# Patient Record
Sex: Female | Born: 2000 | Race: Black or African American | Hispanic: No | Marital: Single | Smoking: Never smoker
Health system: Southern US, Community
[De-identification: ages and names within clinical notes are randomized; demographics above are authoritative.]

## PROBLEM LIST (undated history)

## (undated) ENCOUNTER — Ambulatory Visit: Source: Home / Self Care

## (undated) DIAGNOSIS — K219 Gastro-esophageal reflux disease without esophagitis: Secondary | ICD-10-CM

---
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
## 2021-04-29 IMAGING — CR DG CHEST 2V
2 series · 2 of 2 positions shown · non-contrast
Comparison: None.

CLINICAL DATA: 19-year-old female with chest pain for 3-4 days and
headache.

EXAM:
CHEST - 2 VIEW

[chest pa]
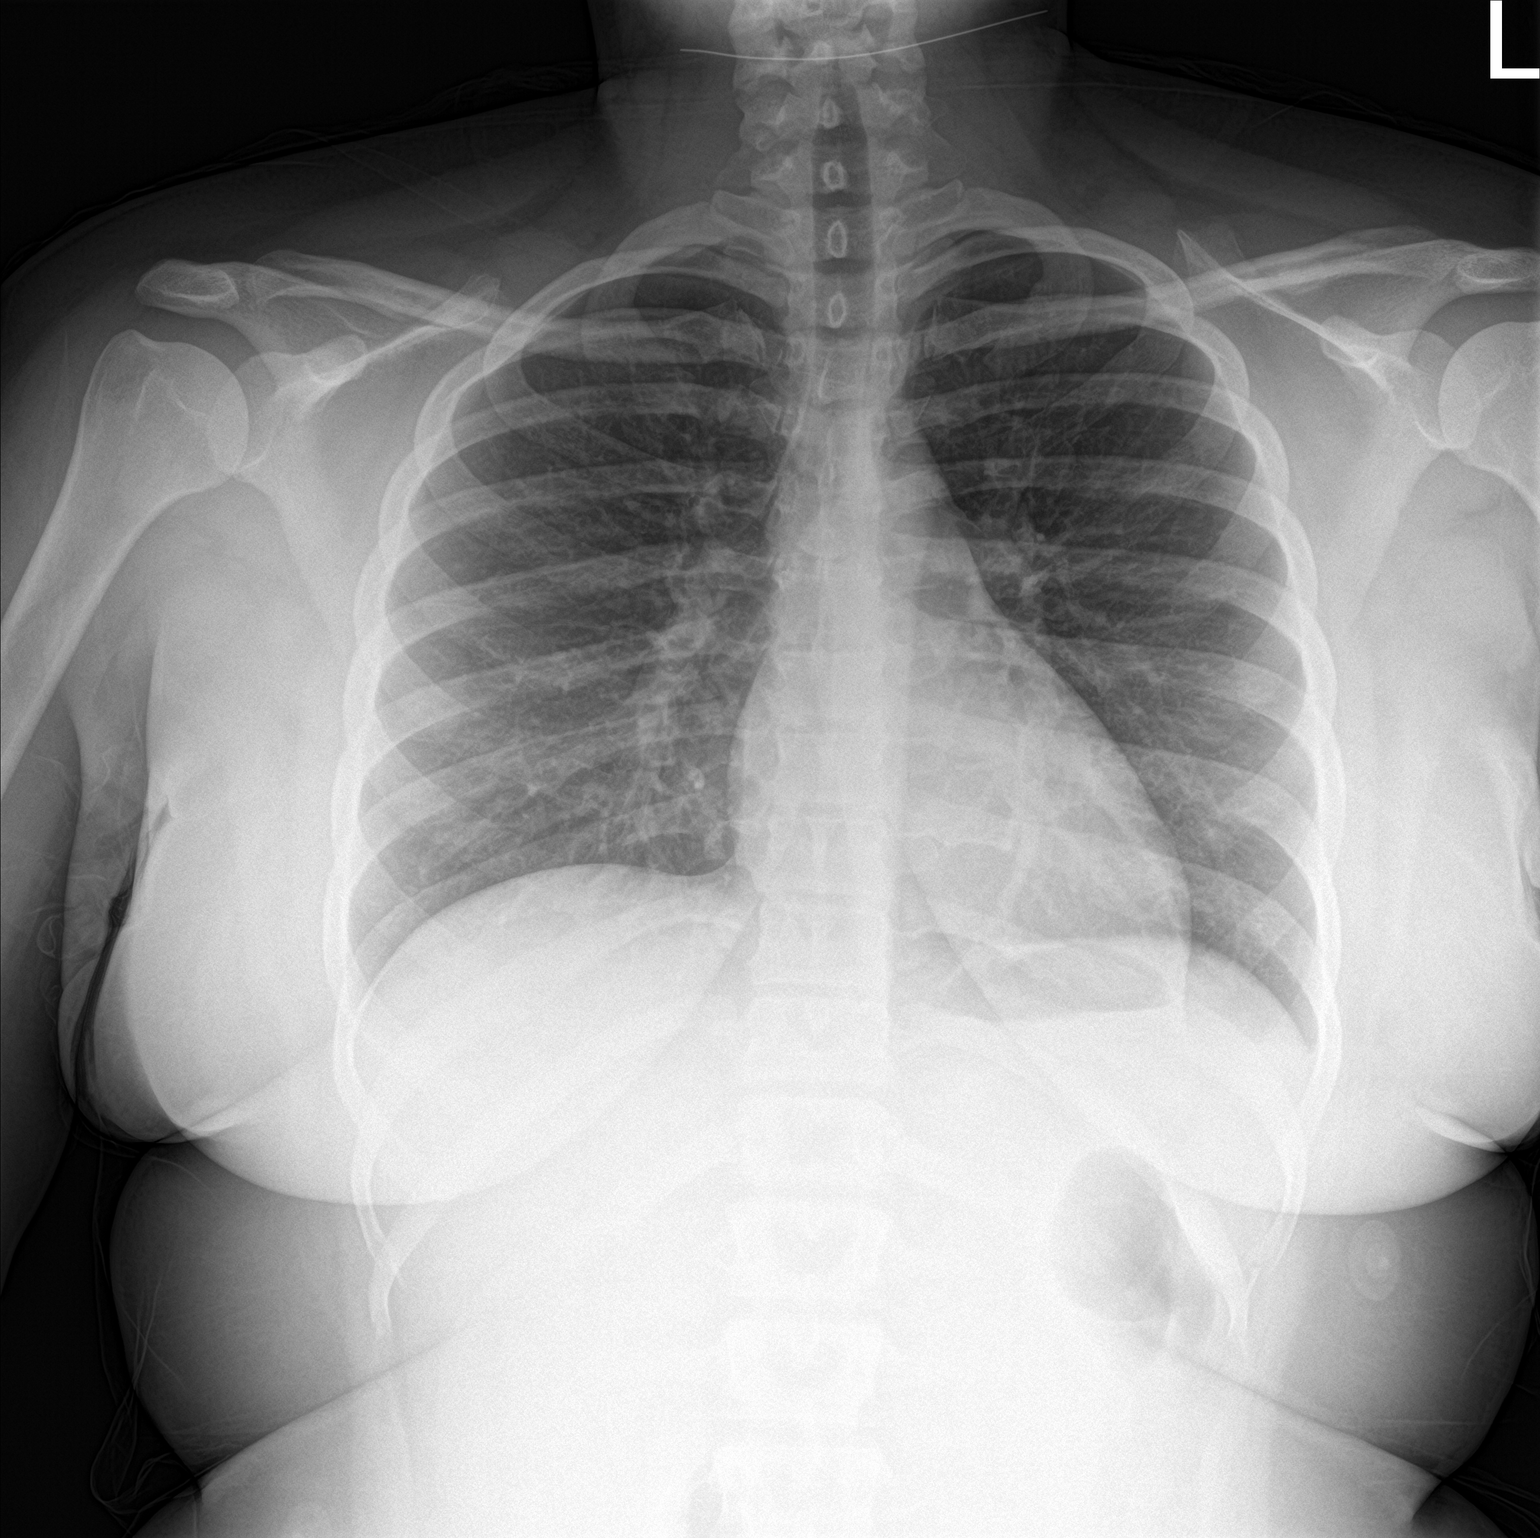

[chest lat]
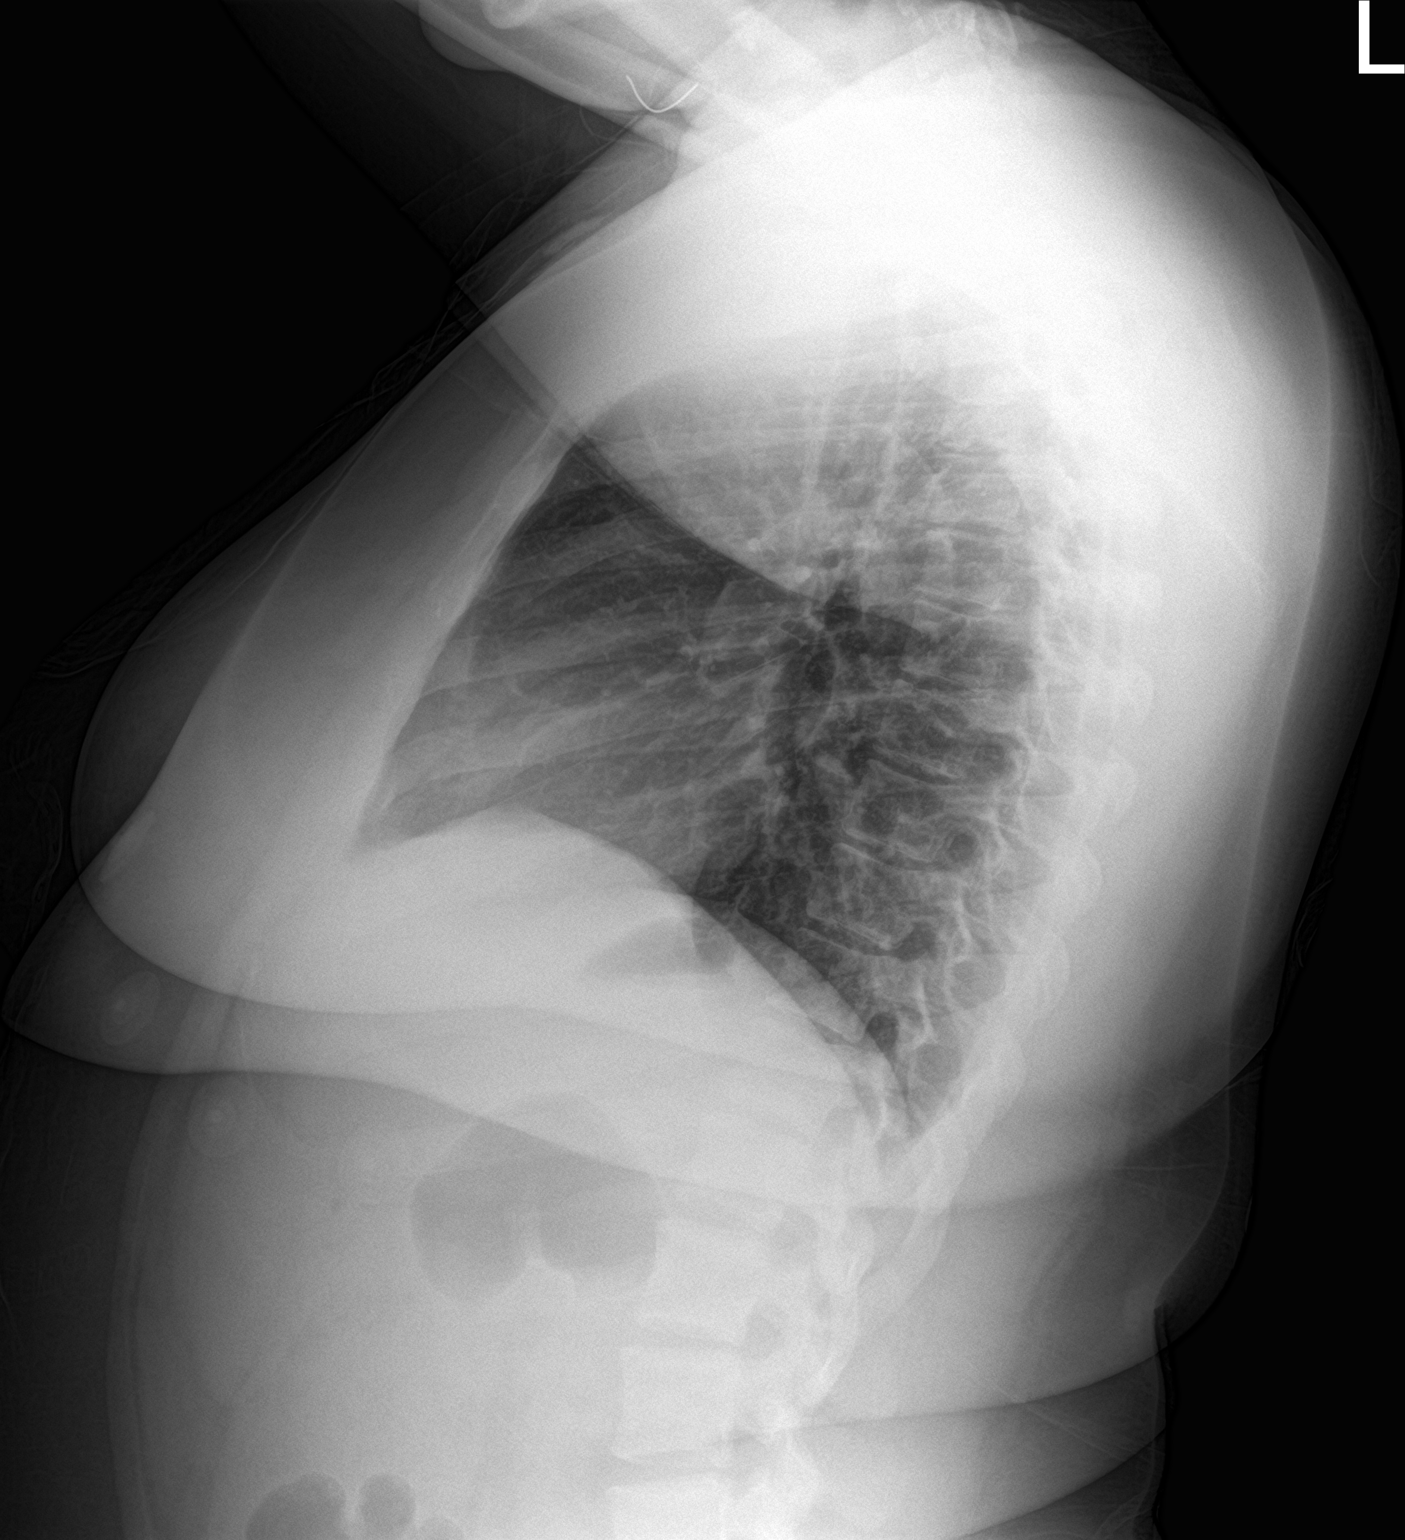

[2 of 2 positions shown; findings below may reference images not displayed]

FINDINGS: Normal lung volumes and mediastinal contours. Visualized tracheal
air column is within normal limits. Both lungs appear clear. No
pneumothorax or pleural effusion.

No osseous abnormality identified. Negative visible bowel gas
pattern.
IMPRESSION: Negative.  No cardiopulmonary abnormality.

## 2022-05-25 ENCOUNTER — Encounter: Payer: Self-pay | Admitting: Emergency Medicine

## 2022-05-25 ENCOUNTER — Ambulatory Visit
Admission: EM | Admit: 2022-05-25 | Discharge: 2022-05-25 | Disposition: A | Discharge status: Home / Self Care | Payer: Medicaid Other | Attending: Family Medicine | Admitting: Family Medicine

## 2022-05-25 DIAGNOSIS — J029 Acute pharyngitis, unspecified: Secondary | ICD-10-CM | POA: Insufficient documentation

## 2022-05-25 DIAGNOSIS — J019 Acute sinusitis, unspecified: Secondary | ICD-10-CM | POA: Insufficient documentation

## 2022-05-25 LAB — POCT RAPID STREP A (OFFICE): Rapid Strep A Screen: NEGATIVE

## 2022-05-25 MED ORDER — BENZONATATE 100 MG PO CAPS: 100.0000 mg | ORAL_CAPSULE | Freq: Three times a day (TID) | ORAL | 0 refills | Status: DC | PRN

## 2022-05-25 MED ORDER — IBUPROFEN 800 MG PO TABS
800.0000 mg | ORAL_TABLET | Freq: Three times a day (TID) | ORAL | 0 refills | Status: DC | PRN
Start: 2022-05-25 — End: 2023-04-25

## 2022-05-25 MED ORDER — AMOXICILLIN 875 MG PO TABS: 875.0000 mg | ORAL_TABLET | Freq: Two times a day (BID) | ORAL | 0 refills | Status: AC

## 2022-05-25 NOTE — ED Triage Notes (Signed)
Pt is present today with left ear pain and sore throat. Pt sx started x2 days ago

## 2022-05-25 NOTE — ED Provider Notes (Signed)
[facility]    CSN: [id number] Arrival date & time: 05/25/22  1401      History   Chief Complaint Chief Complaint  Patient presents with   Sore Throat   Otalgia    HPI Brittany Rodriguez is a 21 y.o. female.    Sore Throat  Otalgia Here for 7-day history of sore throat, cough, and nasal congestion.  About 2 to 3 days ago her ear on the left started hurting also.  She feels that she had some fever in the last 2 or 3 days.  Last menstrual period was sometime ago as she is on birth control  History reviewed. No pertinent past medical history.  There are no problems to display for this patient.   History reviewed. No pertinent surgical history.  OB History   No obstetric history on file.      Home Medications    Prior to Admission medications   Medication Sig Start Date End Date Taking? Authorizing Provider  amoxicillin (AMOXIL) 875 MG tablet Take 1 tablet (875 mg total) by mouth 2 (two) times daily for 7 days. 05/25/22 06/01/22 Yes Beckam Abdulaziz, Gwenlyn Perking, MD  benzonatate (TESSALON) 100 MG capsule Take 1 capsule (100 mg total) by mouth 3 (three) times daily as needed for cough. 05/25/22  Yes Barrett Henle, MD  ibuprofen (ADVIL) 800 MG tablet Take 1 tablet (800 mg total) by mouth every 8 (eight) hours as needed (pain). 05/25/22  Yes Barrett Henle, MD  famotidine (PEPCID) 20 MG tablet Take 1 tablet (20 mg total) by mouth 2 (two) times daily. 08/05/20 11/03/20  Paulla Dolly, MD    Family History History reviewed. No pertinent family history.  Social History Social History   Tobacco Use   Smoking status: Passive Smoke Exposure - Never Smoker   Smokeless tobacco: Never  Substance Use Topics   Drug use: Yes    Frequency: 3.0 times per week    Types: Marijuana    Comment: 1 or 2 a day     Allergies   Patient has no known allergies.   Review of Systems Review of Systems  HENT:  Positive for ear pain.     Physical Exam Triage Vital Signs ED  Triage Vitals  Enc Vitals Group     BP 05/25/22 1447 106/70     Pulse Rate 05/25/22 1447 64     Resp 05/25/22 1447 18     Temp 05/25/22 1447 98.2 F (36.8 C)     Temp src --      SpO2 05/25/22 1447 99 %     Weight --      Height --      Head Circumference --      Peak Flow --      Pain Score 05/25/22 1446 7     Pain Loc --      Pain Edu? --      Excl. in [place]? --    No data found.  Updated Vital Signs BP 106/70   Pulse 64   Temp 98.2 F (36.8 C)   Resp 18   SpO2 99%   Visual Acuity Right Eye Distance:   Left Eye Distance:   Bilateral Distance:    Right Eye Near:   Left Eye Near:    Bilateral Near:     Physical Exam Vitals reviewed.  Constitutional:      General: She is not in acute distress.    Appearance: She is not toxic-appearing.  HENT:     Right Ear: Tympanic membrane and ear canal normal.     Left Ear: Tympanic membrane and ear canal normal.     Ears:     Comments: Tympanic membrane's are bilaterally gray and shiny    Nose: Congestion present.     Mouth/Throat:     Mouth: Mucous membranes are moist.     Comments: There is clear mucus draining and no erythema in the oropharynx Eyes:     Extraocular Movements: Extraocular movements intact.     Conjunctiva/sclera: Conjunctivae normal.     Pupils: Pupils are equal, round, and reactive to light.  Cardiovascular:     Rate and Rhythm: Normal rate and regular rhythm.     Heart sounds: No murmur heard. Pulmonary:     Effort: Pulmonary effort is normal. No respiratory distress.     Breath sounds: No wheezing, rhonchi or rales.  Chest:     Chest wall: No tenderness.  Musculoskeletal:     Cervical back: Neck supple.  Lymphadenopathy:     Cervical: No cervical adenopathy.  Skin:    Capillary Refill: Capillary refill takes less than 2 seconds.     Coloration: Skin is not jaundiced or pale.  Neurological:     General: No focal deficit present.     Mental Status: She is alert and oriented to person, place,  and time.  Psychiatric:        Behavior: Behavior normal.     UC Treatments / Results  Labs (all labs ordered are listed, but only abnormal results are displayed) Labs Reviewed  CULTURE, GROUP A STREP [facility])  POCT RAPID STREP A (OFFICE)    EKG   Radiology No results found.  Procedures Procedures (including critical care time)  Medications Ordered in UC Medications - No data to display  Initial Impression / Assessment and Plan / UC Course  I have reviewed the triage vital signs and the nursing notes.  Pertinent labs & imaging results that were available during my care of the patient were reviewed by me and considered in my medical decision making (see chart for details).     I will treat with amoxicillin for possible sinus infection, due to the length of her symptoms.  I will not do a COVID swab today's due to the length of her symptoms Final Clinical Impressions(s) / UC Diagnoses   Final diagnoses:  Acute sinusitis, recurrence not specified, unspecified location  Sore throat     Discharge Instructions      Your strep test is negative.  Culture of the throat will be sent, and staff will notify you if that is in turn positive.  Take amoxicillin 875 mg--1 tab twice daily for 7 days; this is for possible sinus infection  Take ibuprofen 800 mg--1 tab every 8 hours as needed for pain.  Take benzonatate 100 mg, 1 tab every 8 hours as needed for cough.      ED Prescriptions     Medication Sig Dispense Auth. Provider   amoxicillin (AMOXIL) 875 MG tablet Take 1 tablet (875 mg total) by mouth 2 (two) times daily for 7 days. 14 tablet Pluma Diniz, Janace Aris, MD   benzonatate (TESSALON) 100 MG capsule Take 1 capsule (100 mg total) by mouth 3 (three) times daily as needed for cough. 21 capsule Zenia Resides, MD   ibuprofen (ADVIL) 800 MG tablet Take 1 tablet (800 mg total) by mouth every 8 (eight) hours as needed (pain). 21 tablet [address]  K, MD      PDMP  not reviewed this encounter.   Barrett Henle, MD 05/25/22 [phone]

## 2022-05-25 NOTE — Discharge Instructions (Addendum)
Your strep test is negative.  Culture of the throat will be sent, and staff will notify you if that is in turn positive.  Take amoxicillin 875 mg--1 tab twice daily for 7 days; this is for possible sinus infection  Take ibuprofen 800 mg--1 tab every 8 hours as needed for pain.  Take benzonatate 100 mg, 1 tab every 8 hours as needed for cough.

## 2022-05-28 LAB — CULTURE, GROUP A STREP (THRC)

## 2022-10-08 ENCOUNTER — Encounter (HOSPITAL_COMMUNITY): Payer: Self-pay

## 2022-10-08 ENCOUNTER — Other Ambulatory Visit: Payer: Self-pay

## 2022-10-08 ENCOUNTER — Emergency Department (HOSPITAL_COMMUNITY): Payer: Medicaid Other

## 2022-10-08 ENCOUNTER — Emergency Department (HOSPITAL_COMMUNITY)
Admission: EM | Admit: 2022-10-08 | Discharge: 2022-10-08 | Disposition: A | Discharge status: Home / Self Care | Payer: Medicaid Other | Attending: Student | Admitting: Student

## 2022-10-08 DIAGNOSIS — M546 Pain in thoracic spine: Secondary | ICD-10-CM | POA: Insufficient documentation

## 2022-10-08 DIAGNOSIS — M25561 Pain in right knee: Secondary | ICD-10-CM | POA: Insufficient documentation

## 2022-10-08 MED ORDER — IBUPROFEN 200 MG PO TABS
600.0000 mg | ORAL_TABLET | Freq: Once | ORAL | Status: AC
  Administered 2022-10-08: 600 mg via ORAL
  Filled 2022-10-08: qty 1
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
## 2022-10-08 NOTE — Progress Notes (Signed)
Orthopedic Tech Progress Note Patient Details:  Brittany Rodriguez September 28, 2001 [id number]  Ortho Devices Type of Ortho Device: Knee Sleeve Ortho Device/Splint Location: Right knee Ortho Device/Splint Interventions: Application   Post Interventions Patient Tolerated: Well  Linus Salmons Leslieann Whisman 10/08/2022, 4:52 AM
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
## 2022-10-11 ENCOUNTER — Ambulatory Visit
Admission: EM | Admit: 2022-10-11 | Discharge: 2022-10-11 | Disposition: A | Discharge status: Home / Self Care | Payer: Medicaid Other | Attending: Physician Assistant | Admitting: Physician Assistant

## 2022-10-11 ENCOUNTER — Ambulatory Visit (INDEPENDENT_AMBULATORY_CARE_PROVIDER_SITE_OTHER): Payer: Medicaid Other

## 2022-10-11 DIAGNOSIS — M25571 Pain in right ankle and joints of right foot: Secondary | ICD-10-CM | POA: Diagnosis not present

## 2022-10-11 DIAGNOSIS — M549 Dorsalgia, unspecified: Secondary | ICD-10-CM | POA: Diagnosis not present

## 2022-10-11 DIAGNOSIS — M79671 Pain in right foot: Secondary | ICD-10-CM | POA: Diagnosis not present

## 2022-10-11 DIAGNOSIS — M545 Low back pain, unspecified: Secondary | ICD-10-CM

## 2022-10-11 MED ORDER — CYCLOBENZAPRINE HCL 10 MG PO TABS: 10.0000 mg | ORAL_TABLET | Freq: Two times a day (BID) | ORAL | 0 refills | Status: DC | PRN

## 2022-10-11 MED ORDER — PREDNISONE 20 MG PO TABS: 40.0000 mg | ORAL_TABLET | Freq: Every day | ORAL | 0 refills | Status: AC
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
## 2022-10-12 ENCOUNTER — Encounter: Payer: Self-pay | Admitting: Physician Assistant

## 2023-01-03 ENCOUNTER — Ambulatory Visit
Admission: EM | Admit: 2023-01-03 | Discharge: 2023-01-03 | Discharge status: Against Medical Advice | Payer: Medicaid Other

## 2023-01-04 ENCOUNTER — Ambulatory Visit
Admission: EM | Admit: 2023-01-04 | Discharge: 2023-01-04 | Disposition: A | Discharge status: Home / Self Care | Payer: Medicaid Other | Attending: Family Medicine | Admitting: Family Medicine

## 2023-01-04 VITALS — BP 101/70 | HR 87 | Temp 97.5°F | Resp 18

## 2023-01-04 DIAGNOSIS — M79671 Pain in right foot: Secondary | ICD-10-CM

## 2023-01-04 NOTE — ED Provider Notes (Signed)
Patient presented today for foot pain that has continued. She has not followed up with ortho so recommended further evaluation there as we have no further options for imaging, treatment. Patient expresses understanding.     Francene Finders, PA-C 01/04/23 1343
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
## 2023-04-25 ENCOUNTER — Other Ambulatory Visit: Payer: Self-pay

## 2023-04-25 ENCOUNTER — Encounter (HOSPITAL_BASED_OUTPATIENT_CLINIC_OR_DEPARTMENT_OTHER): Payer: Self-pay | Admitting: Orthopaedic Surgery

## 2023-05-02 ENCOUNTER — Encounter (HOSPITAL_BASED_OUTPATIENT_CLINIC_OR_DEPARTMENT_OTHER): Payer: Self-pay | Admitting: Orthopaedic Surgery

## 2023-05-02 ENCOUNTER — Other Ambulatory Visit: Payer: Self-pay

## 2023-05-02 DIAGNOSIS — Z01818 Encounter for other preprocedural examination: Secondary | ICD-10-CM

## 2023-05-04 NOTE — H&P (Signed)
ORTHOPAEDIC SURGERY H&P  Subjective:  The patient presents with right ankle instability.   Past Medical History:  Diagnosis Date   GERD (gastroesophageal reflux disease)     History reviewed. No pertinent surgical history.   (Not in an outpatient encounter)    No Known Allergies  Social History   Socioeconomic History   Marital status: Single    Spouse name: Not on file   Number of children: Not on file   Years of education: Not on file   Highest education level: Not on file  Occupational History   Not on file  Tobacco Use   Smoking status: Never    Passive exposure: Yes   Smokeless tobacco: Never  Vaping Use   Vaping Use: Every day  Substance and Sexual Activity   Alcohol use: Not Currently   Drug use: Yes    Frequency: 3.0 times per week    Types: Marijuana    Comment: 1 or 2 a day   Sexual activity: Not on file  Other Topics Concern   Not on file  Social History Narrative   Not on file   Social Determinants of Health   Financial Resource Strain: Not on file  Food Insecurity: Not on file  Transportation Needs: Not on file  Physical Activity: Not on file  Stress: Not on file  Social Connections: Not on file  Intimate Partner Violence: Not on file     History reviewed. No pertinent family history.   Review of Systems Pertinent items are noted in HPI.  Objective: Vital signs in last 24 hours:    04/25/2023   11:23 AM 01/04/2023    1:35 PM 10/11/2022    4:16 PM  Vitals with BMI  Height 5\' 0"     Weight 165 lbs    BMI 32.22    Systolic  101 111  Diastolic  70 72  Pulse  87 72      EXAM: General: Well nourished, well developed. Awake, alert and oriented to time, place, person. Normal mood and affect. No apparent distress. Breathing room air.  Operative Lower Extremity: Alignment - Neutral Deformity - None Skin intact Tenderness to palpation - right ankle LLC 5/5 TA, PT, GS, Per, EHL, FHL Sensation intact to light touch  throughout Palpable DP and PT pulses Special testing: None  The contralateral foot/ankle was examined for comparison and noted to be neurovascularly intact with no localized deformity, swelling, or tenderness.  Imaging Review All images taken were independently reviewed by me.  Assessment/Plan: The clinical and radiographic findings were reviewed and discussed at length with the patient.  The patient has right ankle instability.  We spoke at length about the natural course of these findings. We discussed nonoperative and operative treatment options in detail.  The risks and benefits were presented and reviewed. The risks due to implant failure/irritation, infection, stiffness, nerve/vessel/tendon injury, wound healing issues, failure of this surgery, need for further surgery, thromboembolic events, amputation, death among others were discussed. The patient acknowledged the explanation and agreed to proceed with the plan.  Brittany Rodriguez  Orthopaedic Surgery [facility]

## 2023-05-04 NOTE — Discharge Instructions (Signed)
Brittany Cedars, MD [facility]  Please read the following information regarding your care after surgery.  Medications  You only need a prescription for the narcotic pain medicine (ex. oxycodone, Percocet, Norco).  All of the other medicines listed below are available over the counter. ? Aleve 2 pills twice a day for the first 3 days after surgery. ? acetominophen (Tylenol) 650 mg every 4-6 hours as you need for minor to moderate pain. No Tylenol until after 5:45pm today, if needed. ? oxycodone as prescribed for severe pain  ? To help prevent blood clots, take aspirin (81 mg) twice daily for 42 days after surgery (or total duration of nonweightbearing).  You should also get up every hour while you are awake to move around.  Weight Bearing ? Do NOT bear any weight on the operated leg or foot. This means do NOT touch your surgical leg to the ground!  Cast / Splint / Dressing ? If you have a splint, do NOT remove this. Keep your splint, cast or dressing clean and dry.  Don't put anything (coat hanger, pencil, etc) down inside of it.  If it gets wet, call the office immediately to schedule an appointment for a cast change.  Swelling IMPORTANT: It is normal for you to have swelling where you had surgery. To reduce swelling and pain, keep at least 3 pillows under your leg so that your toes are above your nose and your heel is above the level of your hip.  It may be necessary to keep your foot or leg elevated for several weeks.  This is critical to helping your incisions heal and your pain to feel better.  Follow Up Call my office at [phone] when you are discharged from the hospital or surgery center to schedule an appointment to be seen 7-10 days after surgery.  Call my office at [phone] if you develop a fever >101.5 F, nausea, vomiting, bleeding from the surgical site or severe pain.     Post Anesthesia Home Care Instructions  Activity: Get plenty of rest for the remainder of  the day. A responsible individual must stay with you for 24 hours following the procedure.  For the next 24 hours, DO NOT: -Drive a car -Advertising copywriter -Drink alcoholic beverages -Take any medication unless instructed by your physician -Make any legal decisions or sign important papers.  Meals: Start with liquid foods such as gelatin or soup. Progress to regular foods as tolerated. Avoid greasy, spicy, heavy foods. If nausea and/or vomiting occur, drink only clear liquids until the nausea and/or vomiting subsides. Call your physician if vomiting continues.  Special Instructions/Symptoms: Your throat may feel dry or sore from the anesthesia or the breathing tube placed in your throat during surgery. If this causes discomfort, gargle with warm salt water. The discomfort should disappear within 24 hours.  If you had a scopolamine patch placed behind your ear for the management of post- operative nausea and/or vomiting:  1. The medication in the patch is effective for 72 hours, after which it should be removed.  Wrap patch in a tissue and discard in the trash. Wash hands thoroughly with soap and water. 2. You may remove the patch earlier than 72 hours if you experience unpleasant side effects which may include dry mouth, dizziness or visual disturbances. 3. Avoid touching the patch. Wash your hands with soap and water after contact with the patch.   Regional Anesthesia Blocks  1. Numbness or the inability to move the "blocked" extremity may  last from 3-48 hours after placement. The length of time depends on the medication injected and your individual response to the medication. If the numbness is not going away after 48 hours, call your surgeon.  2. The extremity that is blocked will need to be protected until the numbness is gone and the  Strength has returned. Because you cannot feel it, you will need to take extra care to avoid injury. Because it may be weak, you may have difficulty moving  it or using it. You may not know what position it is in without looking at it while the block is in effect.  3. For blocks in the legs and feet, returning to weight bearing and walking needs to be done carefully. You will need to wait until the numbness is entirely gone and the strength has returned. You should be able to move your leg and foot normally before you try and bear weight or walk. You will need someone to be with you when you first try to ensure you do not fall and possibly risk injury.  4. Bruising and tenderness at the needle site are common side effects and will resolve in a few days.  5. Persistent numbness or new problems with movement should be communicated to the surgeon or the [facility] [phone] [facility] [phone]).

## 2023-05-09 ENCOUNTER — Ambulatory Visit (HOSPITAL_BASED_OUTPATIENT_CLINIC_OR_DEPARTMENT_OTHER): Payer: Medicaid Other | Admitting: Certified Registered"

## 2023-05-09 ENCOUNTER — Encounter (HOSPITAL_BASED_OUTPATIENT_CLINIC_OR_DEPARTMENT_OTHER): Payer: Self-pay | Admitting: Orthopaedic Surgery

## 2023-05-09 ENCOUNTER — Encounter (HOSPITAL_BASED_OUTPATIENT_CLINIC_OR_DEPARTMENT_OTHER)
Admission: RE | Disposition: A | Discharge status: Home / Self Care | Payer: Self-pay | Source: Home / Self Care | Attending: Orthopaedic Surgery

## 2023-05-09 ENCOUNTER — Ambulatory Visit (HOSPITAL_BASED_OUTPATIENT_CLINIC_OR_DEPARTMENT_OTHER)
Admission: RE | Admit: 2023-05-09 | Discharge: 2023-05-09 | Disposition: A | Discharge status: Home / Self Care | Payer: Medicaid Other | Attending: Orthopaedic Surgery | Admitting: Orthopaedic Surgery

## 2023-05-09 ENCOUNTER — Other Ambulatory Visit: Payer: Self-pay

## 2023-05-09 DIAGNOSIS — S93491D Sprain of other ligament of right ankle, subsequent encounter: Secondary | ICD-10-CM

## 2023-05-09 DIAGNOSIS — M25371 Other instability, right ankle: Secondary | ICD-10-CM | POA: Diagnosis present

## 2023-05-09 DIAGNOSIS — S93401A Sprain of unspecified ligament of right ankle, initial encounter: Secondary | ICD-10-CM | POA: Insufficient documentation

## 2023-05-09 DIAGNOSIS — X58XXXA Exposure to other specified factors, initial encounter: Secondary | ICD-10-CM | POA: Insufficient documentation

## 2023-05-09 DIAGNOSIS — Z01818 Encounter for other preprocedural examination: Secondary | ICD-10-CM

## 2023-05-09 HISTORY — PX: ANKLE ARTHROSCOPY WITH RECONSTRUCTION: SHX5583

## 2023-05-09 HISTORY — DX: Gastro-esophageal reflux disease without esophagitis: K21.9

## 2023-05-09 LAB — POCT PREGNANCY, URINE: Preg Test, Ur: NEGATIVE

## 2023-05-09 SURGERY — ARTHROSCOPY, ANKLE, WITH RECONSTRUCTION
Anesthesia: General | Site: Ankle | Laterality: Right

## 2023-05-09 MED ORDER — SODIUM CHLORIDE (PF) 0.9 % IJ SOLN
INTRAMUSCULAR | Status: AC
  Filled 2023-05-09: qty 10

## 2023-05-09 MED ORDER — CEFAZOLIN SODIUM-DEXTROSE 2-4 GM/100ML-% IV SOLN
2.0000 g | INTRAVENOUS | Status: AC
  Administered 2023-05-09: 2 g via INTRAVENOUS

## 2023-05-09 MED ORDER — MIDAZOLAM HCL 2 MG/2ML IJ SOLN
2.0000 mg | Freq: Once | INTRAMUSCULAR | Status: AC
  Administered 2023-05-09: 2 mg via INTRAVENOUS

## 2023-05-09 MED ORDER — HYDROMORPHONE HCL 1 MG/ML IJ SOLN: 0.2500 mg | INTRAMUSCULAR | Status: DC | PRN

## 2023-05-09 MED ORDER — FENTANYL CITRATE (PF) 100 MCG/2ML IJ SOLN
INTRAMUSCULAR | Status: AC
  Filled 2023-05-09: qty 2

## 2023-05-09 MED ORDER — CHLORHEXIDINE GLUCONATE 4 % EX SOLN: 60.0000 mL | Freq: Once | CUTANEOUS | Status: DC

## 2023-05-09 MED ORDER — CEFAZOLIN SODIUM-DEXTROSE 2-4 GM/100ML-% IV SOLN
INTRAVENOUS | Status: AC
  Filled 2023-05-09: qty 100

## 2023-05-09 MED ORDER — DEXAMETHASONE SODIUM PHOSPHATE 10 MG/ML IJ SOLN
INTRAMUSCULAR | Status: DC | PRN
  Administered 2023-05-09: 4 mg via INTRAVENOUS

## 2023-05-09 MED ORDER — 0.9 % SODIUM CHLORIDE (POUR BTL) OPTIME
TOPICAL | Status: DC | PRN
  Administered 2023-05-09: 300 mL

## 2023-05-09 MED ORDER — FENTANYL CITRATE (PF) 100 MCG/2ML IJ SOLN
INTRAMUSCULAR | Status: DC | PRN
  Administered 2023-05-09: 25 ug via INTRAVENOUS

## 2023-05-09 MED ORDER — VANCOMYCIN HCL 500 MG IV SOLR
INTRAVENOUS | Status: DC | PRN
  Administered 2023-05-09: 500 mg via TOPICAL

## 2023-05-09 MED ORDER — ACETAMINOPHEN 500 MG PO TABS
ORAL_TABLET | ORAL | Status: AC
  Filled 2023-05-09: qty 2

## 2023-05-09 MED ORDER — CEFAZOLIN SODIUM 1 G IJ SOLR
INTRAMUSCULAR | Status: AC
  Filled 2023-05-09: qty 20

## 2023-05-09 MED ORDER — LIDOCAINE HCL (CARDIAC) PF 100 MG/5ML IV SOSY
PREFILLED_SYRINGE | INTRAVENOUS | Status: DC | PRN
  Administered 2023-05-09: 30 mg via INTRAVENOUS

## 2023-05-09 MED ORDER — LACTATED RINGERS IV SOLN: INTRAVENOUS | Status: DC

## 2023-05-09 MED ORDER — FENTANYL CITRATE (PF) 100 MCG/2ML IJ SOLN
100.0000 ug | Freq: Once | INTRAMUSCULAR | Status: AC
  Administered 2023-05-09: 100 ug via INTRAVENOUS

## 2023-05-09 MED ORDER — PROPOFOL 10 MG/ML IV BOLUS
INTRAVENOUS | Status: DC | PRN
  Administered 2023-05-09: 150 mg via INTRAVENOUS

## 2023-05-09 MED ORDER — ACETAMINOPHEN 500 MG PO TABS
1000.0000 mg | ORAL_TABLET | Freq: Once | ORAL | Status: AC
  Administered 2023-05-09: 1000 mg via ORAL

## 2023-05-09 MED ORDER — PROPOFOL 500 MG/50ML IV EMUL
INTRAVENOUS | Status: DC | PRN
  Administered 2023-05-09: 170 ug/kg/min via INTRAVENOUS

## 2023-05-09 MED ORDER — MIDAZOLAM HCL 2 MG/2ML IJ SOLN
INTRAMUSCULAR | Status: AC
  Filled 2023-05-09: qty 2

## 2023-05-09 MED ORDER — BUPIVACAINE-EPINEPHRINE (PF) 0.5% -1:200000 IJ SOLN
INTRAMUSCULAR | Status: DC | PRN
  Administered 2023-05-09: 30 mL via PERINEURAL
  Administered 2023-05-09: 10 mL via PERINEURAL

## 2023-05-09 SURGICAL SUPPLY — 84 items
ANCH SUT NDL DX FBRTK STRL LF (Anchor) ×1 IMPLANT
ANCH SUT NDL STRL LF DX FBRTK (Anchor) ×1 IMPLANT
ANCHOR SUT FBRTK 1.3 SUTTAP (Anchor) IMPLANT
ANCHOR SUT FIBERTAK DX DBL (Anchor) IMPLANT
APL PRP STRL LF DISP 70% ISPRP (MISCELLANEOUS) ×1
ASCP RGD 125 NANO NDL SCP (MISCELLANEOUS) ×1
BANDAGE ESMARK 6X9 LF (GAUZE/BANDAGES/DRESSINGS) ×2 IMPLANT
BLADE AVERAGE 25X9 (BLADE) IMPLANT
BLADE SURG 15 STRL LF DISP TIS (BLADE) ×4 IMPLANT
BLADE SURG 15 STRL SS (BLADE) ×2
BNDG CMPR 5X4 CHSV STRCH STRL (GAUZE/BANDAGES/DRESSINGS) ×1
BNDG CMPR 5X4 KNIT ELC UNQ LF (GAUZE/BANDAGES/DRESSINGS) ×1
BNDG CMPR 5X62 HK CLSR LF (GAUZE/BANDAGES/DRESSINGS)
BNDG CMPR 6"X 5 YARDS HK CLSR (GAUZE/BANDAGES/DRESSINGS)
BNDG CMPR 9X6 STRL LF SNTH (GAUZE/BANDAGES/DRESSINGS) ×1
BNDG CMPR THK2 5X4 CHSV NS (GAUZE/BANDAGES/DRESSINGS) ×1
BNDG COHESIVE 4X5 TAN NS LF (GAUZE/BANDAGES/DRESSINGS) ×2 IMPLANT
BNDG COHESIVE 4X5 TAN STRL LF (GAUZE/BANDAGES/DRESSINGS) ×2 IMPLANT
BNDG ELASTIC 4INX 5YD STR LF (GAUZE/BANDAGES/DRESSINGS) ×2 IMPLANT
BNDG ELASTIC 6INX 5YD STR LF (GAUZE/BANDAGES/DRESSINGS) IMPLANT
BNDG ESMARK 6X9 LF (GAUZE/BANDAGES/DRESSINGS) ×1
BNDG GAUZE DERMACEA FLUFF 4 (GAUZE/BANDAGES/DRESSINGS) ×2 IMPLANT
BNDG GZE DERMACEA 4 6PLY (GAUZE/BANDAGES/DRESSINGS) ×1
CHLORAPREP W/TINT 26 (MISCELLANEOUS) ×2 IMPLANT
COVER BACK TABLE 60X90IN (DRAPES) ×2 IMPLANT
CUFF TOURN SGL QUICK 34 (TOURNIQUET CUFF)
CUFF TRNQT CYL 34X4.125X (TOURNIQUET CUFF) IMPLANT
DRAPE EXTREMITY T 121X128X90 (DISPOSABLE) ×2 IMPLANT
DRAPE OEC MINIVIEW 54X84 (DRAPES) IMPLANT
DRAPE U-SHAPE 47X51 STRL (DRAPES) ×2 IMPLANT
DRSG MEPITEL 4X7.2 (GAUZE/BANDAGES/DRESSINGS) ×2 IMPLANT
ELECT REM PT RETURN 9FT ADLT (ELECTROSURGICAL) ×1
ELECTRODE REM PT RTRN 9FT ADLT (ELECTROSURGICAL) ×2 IMPLANT
GAUZE PAD ABD 8X10 STRL (GAUZE/BANDAGES/DRESSINGS) ×4 IMPLANT
GAUZE SPONGE 4X4 12PLY STRL (GAUZE/BANDAGES/DRESSINGS) ×2 IMPLANT
GLOVE BIOGEL PI IND STRL 8 (GLOVE) ×4 IMPLANT
GLOVE SURG SS PI 7.5 STRL IVOR (GLOVE) ×4 IMPLANT
GOWN STRL REUS W/ TWL LRG LVL3 (GOWN DISPOSABLE) ×4 IMPLANT
GOWN STRL REUS W/TWL LRG LVL3 (GOWN DISPOSABLE) ×2
KIT FIBERTAK DX 1.6 DISP (KITS) IMPLANT
NANONEEDLE HIGHFLOW SHEATH 125 (SHEATH) ×1
NDL HYPO 22X1.5 SAFETY MO (MISCELLANEOUS) IMPLANT
NDL HYPO 25X1 1.5 SAFETY (NEEDLE) IMPLANT
NDL SUT 6 .5 CRC .975X.05 MAYO (NEEDLE) IMPLANT
NEEDLE HYPO 22X1.5 SAFETY MO (MISCELLANEOUS) IMPLANT
NEEDLE HYPO 25X1 1.5 SAFETY (NEEDLE) IMPLANT
NEEDLE MAYO TAPER (NEEDLE)
NS IRRIG 1000ML POUR BTL (IV SOLUTION) ×2 IMPLANT
PACK BASIN DAY SURGERY FS (CUSTOM PROCEDURE TRAY) ×2 IMPLANT
PAD CAST 4YDX4 CTTN HI CHSV (CAST SUPPLIES) ×2 IMPLANT
PADDING CAST ABS COTTON 4X4 ST (CAST SUPPLIES) IMPLANT
PADDING CAST COTTON 4X4 STRL (CAST SUPPLIES) ×1
PADDING CAST SYNTHETIC 4X4 STR (CAST SUPPLIES) ×4 IMPLANT
PASSER SUT SWANSON 36MM LOOP (INSTRUMENTS) IMPLANT
PENCIL SMOKE EVACUATOR (MISCELLANEOUS) ×2 IMPLANT
RETRIEVER SUT HEWSON (MISCELLANEOUS) IMPLANT
SANITIZER HAND PURELL FF 515ML (MISCELLANEOUS) ×2 IMPLANT
SCOPE NANONDL 125 (MISCELLANEOUS) IMPLANT
SCOPE NANONEEDLE 125 (MISCELLANEOUS) ×1 IMPLANT
SET IRRIG Y TYPE TUR BLADDER L (SET/KITS/TRAYS/PACK) IMPLANT
SHEATH NANONDL HIGHFLOW 125 (SHEATH) IMPLANT
SHEATH NANONEEDLE HIGHFLOW 125 (SHEATH) ×1 IMPLANT
SHEET MEDIUM DRAPE 40X70 STRL (DRAPES) ×2 IMPLANT
SLEEVE SCD COMPRESS KNEE MED (STOCKING) ×2 IMPLANT
SPIKE FLUID TRANSFER (MISCELLANEOUS) IMPLANT
SPLINT PLASTER CAST FAST 5X30 (CAST SUPPLIES) ×40 IMPLANT
SPONGE T-LAP 18X18 ~~LOC~~+RFID (SPONGE) ×2 IMPLANT
STOCKINETTE 6  STRL (DRAPES) ×1
STOCKINETTE 6 STRL (DRAPES) ×2 IMPLANT
SUCTION FRAZIER HANDLE 10FR (MISCELLANEOUS) ×1
SUCTION TUBE FRAZIER 10FR DISP (MISCELLANEOUS) ×2 IMPLANT
SUT ETHIBOND 2 OS 4 DA (SUTURE) IMPLANT
SUT ETHILON 2 0 FS 18 (SUTURE) ×2 IMPLANT
SUT FIBERWIRE 2-0 18 17.9 3/8 (SUTURE)
SUT MERSILENE 2.0 SH NDLE (SUTURE) IMPLANT
SUT MNCRL AB 3-0 PS2 18 (SUTURE) ×2 IMPLANT
SUT VIC AB 2-0 SH 27 (SUTURE)
SUT VIC AB 2-0 SH 27XBRD (SUTURE) IMPLANT
SUT VICRYL 0 SH 27 (SUTURE) IMPLANT
SUTURE FIBERWR 2-0 18 17.9 3/8 (SUTURE) IMPLANT
SYR BULB EAR ULCER 3OZ GRN STR (SYRINGE) ×2 IMPLANT
TOWEL GREEN STERILE FF (TOWEL DISPOSABLE) ×4 IMPLANT
TUBE CONNECTING 20X1/4 (TUBING) ×2 IMPLANT
UNDERPAD 30X36 HEAVY ABSORB (UNDERPADS AND DIAPERS) ×2 IMPLANT

## 2023-05-09 NOTE — Transfer of Care (Signed)
Immediate Anesthesia Transfer of Care Note  Patient: Brittany Rodriguez  Procedure(s) Performed: ANKLE ARTHROSCOPY WITH open lateral ankle stabilization (Right: Ankle)  Patient Location: PACU  Anesthesia Type:GA combined with regional for post-op pain  Level of Consciousness: drowsy  Airway & Oxygen Therapy: Patient Spontanous Breathing and Patient connected to face mask oxygen  Post-op Assessment: Report given to RN and Post -op Vital signs reviewed and stable  Post vital signs: Reviewed and stable  Last Vitals:  Vitals Value Taken Time  BP 105/58 05/09/23 1415  Temp    Pulse 57 05/09/23 1417  Resp 15 05/09/23 1417  SpO2 98 % 05/09/23 1417  Vitals shown include unvalidated device data.  Last Pain:  Vitals:   05/09/23 1136  PainSc: 1          Complications: No notable events documented.

## 2023-05-09 NOTE — Anesthesia Postprocedure Evaluation (Signed)
Anesthesia Post Note  Patient: Brittany Rodriguez  Procedure(s) Performed: ANKLE ARTHROSCOPY WITH open lateral ankle stabilization (Right: Ankle)     Patient location during evaluation: PACU Anesthesia Type: General and Regional Level of consciousness: awake and alert Pain management: pain level controlled Vital Signs Assessment: post-procedure vital signs reviewed and stable Respiratory status: spontaneous breathing, nonlabored ventilation and respiratory function stable Cardiovascular status: blood pressure returned to baseline and stable Postop Assessment: no apparent nausea or vomiting Anesthetic complications: no  No notable events documented.  Last Vitals:  Vitals:   05/09/23 1445 05/09/23 1508  BP: 102/61 (!) 100/51  Pulse: (!) 53 60  Resp: (!) 22 16  Temp:  36.6 C  SpO2: 96% 98%    Last Pain:  Vitals:   05/09/23 1508  TempSrc: Temporal  PainSc: 0-No pain                 Ghina Bittinger,W. EDMOND

## 2023-05-09 NOTE — H&P (Signed)
H&P Update:  -History and Physical Reviewed  -Patient has been re-examined  -No change in the plan of care  -The risks and benefits were presented and reviewed. The risks due to arthroscopy, hardware/suture failure and/or irritation, new/persistent infection, stiffness, nerve/vessel/tendon injury or rerupture of repaired tendon, nonunion/malunion, allograft usage, wound healing issues, development of arthritis, failure of this surgery, possibility of external fixation with delayed definitive surgery, need for further surgery, thromboembolic events, anesthesia/medical complications, amputation, death among others were discussed. The patient acknowledged the explanation, agreed to proceed with the plan and a consent was signed.  Brittany Rodriguez  

## 2023-05-09 NOTE — Anesthesia Procedure Notes (Signed)
Anesthesia Regional Block: Popliteal block   Pre-Anesthetic Checklist: , timeout performed,  Correct Patient, Correct Site, Correct Laterality,  Correct Procedure, Correct Position, site marked,  Risks and benefits discussed,  Pre-op evaluation,  At surgeon's request and post-op pain management  Laterality: Right  Prep: Maximum Sterile Barrier Precautions used, chloraprep       Needles:  Injection technique: Single-shot  Needle Type: Echogenic Stimulator Needle     Needle Length: 9cm  Needle Gauge: 21     Additional Needles:   Procedures:,,,, ultrasound used (permanent image in chart),,    Narrative:  Start time: 05/09/2023 12:06 PM End time: 05/09/2023 12:16 PM Injection made incrementally with aspirations every 5 mL.  Performed by: Personally  Anesthesiologist: Gaynelle Adu, MD

## 2023-05-09 NOTE — Progress Notes (Signed)
Assisted Dr. Edmond Fitzgerald with right, adductor canal, popliteal, ultrasound guided block. Side rails up, monitors on throughout procedure. See vital signs in flow sheet. Tolerated Procedure well. 

## 2023-05-09 NOTE — Anesthesia Procedure Notes (Signed)
Procedure Name: LMA Insertion Date/Time: 05/09/2023 1:10 PM  Performed by: Kerron Sedano, Jewel Baize, CRNAPre-anesthesia Checklist: Patient identified, Emergency Drugs available, Suction available and Patient being monitored Patient Re-evaluated:Patient Re-evaluated prior to induction Oxygen Delivery Method: Circle system utilized Preoxygenation: Pre-oxygenation with 100% oxygen Induction Type: IV induction Ventilation: Mask ventilation without difficulty LMA: LMA inserted LMA Size: 4.0 Number of attempts: 1 Airway Equipment and Method: Bite block Placement Confirmation: positive ETCO2 Tube secured with: Tape Dental Injury: Teeth and Oropharynx as per pre-operative assessment

## 2023-05-09 NOTE — Op Note (Signed)
05/09/2023  2:08 PM   PATIENT: Brittany Rodriguez  22 y.o. 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   INDICATION FOR PROCEDURE: The patient presented with above diagnosis.  We discussed the diagnosis, alternative treatment options, risks and benefits of the above surgical intervention, as well as alternative non-operative treatments. All questions/concerns were addressed and the patient/family demonstrated appropriate understanding of the diagnosis, the procedure, the postoperative course, and overall prognosis. The patient wished to proceed with surgical intervention and signed an informed surgical consent as such, in each others presence prior to surgery.   PROCEDURE IN DETAIL: After preoperative consent was obtained and the correct operative site was identified, the patient was brought to the operating room supine on stretcher and transferred onto operating table. General anesthesia was induced. Preoperative antibiotics were administered. Surgical timeout was taken. The patient was then positioned supine with an ipsilateral hip bump. The operative lower extremity was prepped and draped in standard sterile fashion with a tourniquet around the thigh. The extremity was exsanguinated and the tourniquet was inflated to 275 mmHg.  Routine evaluation of the ankle joint demonstrated gross laxity with drawer testing. Full  dorsiflexion as well as plantarflexion was possible.   We began by insufflating the ankle joint thru anteromedial approach. The anteromedial portal was carefully established medial to the tibialis anterior tendon. The arthroscopic trochar with blunt was inserted and then camera placed. There was excellent visualization of the joint and routine diagnostic ankle arthroscopy was performed. Of note, there was mild synovitis throughout the joint. No chondral changes in the tibiotalar joint surfaces. No loose bodies were encountered and no anterior ankle impingement was identified on max dorsiflexion. The deltoid and syndesmosis ligaments were stressed and noted to be stable. Arthroscopic assisted debridement of the ankle joint was performed.    A standard curvilinear approach was made over the lateral ankle ligament complex and the distal lateral malleolus.    Dissection was the carried down to the level of the lateral ankle ligament complex. We sharply incised the capsule and the complex just distal to the tip of the fibula leaving a small cuff of tissue for repair after advancement. The proximal flap was elevated carefully off the lateral malleolus. We then used a rongeur to roughen the distal lateral malleolus. Two Arthrex FiberTak anchors were implanted using standard technique in the anatomic footprints of the ATFL and CFL ligaments. These were verified by manual stress to be well seated within bone. The suture needles were sequentially passed through the distal flap, tied, and then brought back proximally into the proximal flap were they were then tied.The foot was held in eversion throughout to set appropriate tension and protect the repair. Intraoperative ankle testing demonstrated improved stability of the newly reconstructed lateral ankle ligament complex.   The surgical sites were thoroughly irrigated. The tourniquet was deflated and hemostasis achieved. Betadine and vancomycin powder applied. The deep  layers were closed using 2-0 vicryl.  The skin was closed without tension using 2-0 nylon suture.    The leg was cleaned with saline and sterile mepitel dressings with gauze were applied. A well padded short leg splint was applied. The patient was awakened from anesthesia and transported to the recovery room in stable condition.      FOLLOW UP PLAN: -transfer to PACU, then home -strict NWB operative extremity, maximum elevation -maintain short leg splint until follow up -DVT ppx: Aspirin 81 mg twice daily while NWB -follow up as outpatient within 1 wk for wound check with exchange of short leg splint to short leg cast -sutures out in 2-3 weeks in outpatient office     RADIOGRAPHS: None utilized     [employer] Orthopaedic Surgery [facility]

## 2023-05-09 NOTE — Anesthesia Procedure Notes (Signed)
Anesthesia Regional Block: Adductor canal block   Pre-Anesthetic Checklist: , timeout performed,  Correct Patient, Correct Site, Correct Laterality,  Correct Procedure, Correct Position, site marked,  Risks and benefits discussed,  Pre-op evaluation,  At surgeon's request and post-op pain management  Laterality: Right  Prep: Maximum Sterile Barrier Precautions used, chloraprep       Needles:  Injection technique: Single-shot  Needle Type: Echogenic Stimulator Needle     Needle Length: 9cm  Needle Gauge: 21     Additional Needles:   Procedures:,,,, ultrasound used (permanent image in chart),,    Narrative:  Start time: 05/09/2023 12:16 PM End time: 05/09/2023 12:20 PM Injection made incrementally with aspirations every 5 mL.  Performed by: Personally  Anesthesiologist: Gaynelle Adu, MD

## 2023-05-09 NOTE — Anesthesia Preprocedure Evaluation (Addendum)
Anesthesia Evaluation  Patient identified by MRN, date of birth, ID band Patient awake    Reviewed: Allergy & Precautions, H&P , NPO status , Patient's Chart, lab work & pertinent test results  Airway Mallampati: II  TM Distance: >3 FB Neck ROM: Full    Dental no notable dental hx. (+) Teeth Intact, Dental Advisory Given   Pulmonary neg pulmonary ROS, Patient abstained from smoking.   Pulmonary exam normal breath sounds clear to auscultation       Cardiovascular negative cardio ROS  Rhythm:Regular Rate:Normal     Neuro/Psych negative neurological ROS  negative psych ROS   GI/Hepatic Neg liver ROS,GERD  ,,  Endo/Other  negative endocrine ROS    Renal/GU negative Renal ROS  negative genitourinary   Musculoskeletal   Abdominal   Peds  Hematology negative hematology ROS (+)   Anesthesia Other Findings   Reproductive/Obstetrics negative OB ROS                             Anesthesia Physical Anesthesia Plan  ASA: 2  Anesthesia Plan: General   Post-op Pain Management: Regional block* and Tylenol PO (pre-op)*   Induction: Intravenous  PONV Risk Score and Plan: 4 or greater and Dexamethasone, Propofol infusion, TIVA and Midazolam  Airway Management Planned: LMA  Additional Equipment:   Intra-op Plan:   Post-operative Plan: Extubation in OR  Informed Consent: I have reviewed the patients History and Physical, chart, labs and discussed the procedure including the risks, benefits and alternatives for the proposed anesthesia with the patient or authorized representative who has indicated his/her understanding and acceptance.     Dental advisory given  Plan Discussed with: CRNA  Anesthesia Plan Comments:        Anesthesia Quick Evaluation

## 2023-05-10 ENCOUNTER — Encounter (HOSPITAL_BASED_OUTPATIENT_CLINIC_OR_DEPARTMENT_OTHER): Payer: Self-pay | Admitting: Orthopaedic Surgery

## 2023-11-03 ENCOUNTER — Other Ambulatory Visit: Payer: Self-pay

## 2023-11-03 ENCOUNTER — Encounter (HOSPITAL_COMMUNITY): Payer: Self-pay | Admitting: Emergency Medicine

## 2023-11-03 ENCOUNTER — Emergency Department (HOSPITAL_COMMUNITY)
Admission: EM | Admit: 2023-11-03 | Discharge: 2023-11-03 | Disposition: A | Discharge status: Home / Self Care | Payer: Medicaid Other | Attending: Emergency Medicine | Admitting: Emergency Medicine

## 2023-11-03 DIAGNOSIS — W268XXA Contact with other sharp object(s), not elsewhere classified, initial encounter: Secondary | ICD-10-CM | POA: Insufficient documentation

## 2023-11-03 DIAGNOSIS — S61210A Laceration without foreign body of right index finger without damage to nail, initial encounter: Secondary | ICD-10-CM | POA: Insufficient documentation

## 2023-11-03 DIAGNOSIS — S6991XA Unspecified injury of right wrist, hand and finger(s), initial encounter: Secondary | ICD-10-CM | POA: Diagnosis present

## 2023-11-03 MED ORDER — BACITRACIN ZINC 500 UNIT/GM EX OINT
TOPICAL_OINTMENT | CUTANEOUS | Status: AC
  Administered 2023-11-03: 1
  Filled 2023-11-03: qty 0.9

## 2023-11-03 MED ORDER — LIDOCAINE HCL (PF) 1 % IJ SOLN
5.0000 mL | Freq: Once | INTRAMUSCULAR | Status: AC
  Administered 2023-11-03: 5 mL
  Filled 2023-11-03: qty 30

## 2023-11-03 NOTE — Discharge Instructions (Signed)
Your laceration was repaired with 4 stitches. These will need to come out in 7 days. For concerning symptoms return to the emergency room.

## 2023-11-03 NOTE — ED Provider Notes (Signed)
Brittany Rodriguez EMERGENCY DEPARTMENT AT [facility] Provider Note   CSN: [id number] Arrival date & time: 11/03/23  1731     History  Chief Complaint  Patient presents with   finger laceration    Brittany Rodriguez is a 22 y.o. female.  22 year old female presents today for concern of laceration to the right index finger at the distal end.  This occurred while she was using a manual can opener.  She is up-to-date on her tetanus shot.  This was updated 2 years ago according that her.  She is right-hand dominant.  The history is provided by the patient. No language interpreter was used.       Home Medications Prior to Admission medications   Medication Sig Start Date End Date Taking? Authorizing Provider  acetaminophen (TYLENOL) 650 MG CR tablet Take 650 mg by mouth every 8 (eight) hours as needed for pain.    [provider]  diclofenac Sodium (VOLTAREN) 1 % GEL Apply topically 4 (four) times daily.    [provider]  medroxyPROGESTERone Acetate (DEPO-PROVERA IM) Inject into the muscle.    [provider]      Allergies    Patient has no known allergies.    Review of Systems   Review of Systems  Constitutional:  Negative for chills and fever.  Skin:  Positive for wound.  All other systems reviewed and are negative.   Physical Exam Updated Vital Signs BP (!) 142/84 (BP Location: Right Arm)   Pulse (!) 102   Temp 98.1 F (36.7 C) (Oral)   Resp 18   Ht 5\' 1"  (1.549 m)   Wt 74.8 kg   SpO2 97%   BMI 31.18 kg/m  Physical Exam Vitals and nursing note reviewed.  Constitutional:      General: She is not in acute distress.    Appearance: Normal appearance. She is not ill-appearing.  HENT:     Head: Normocephalic and atraumatic.     Nose: Nose normal.  Eyes:     Conjunctiva/sclera: Conjunctivae normal.  Cardiovascular:     Rate and Rhythm: Normal rate and regular rhythm.  Pulmonary:     Effort: Pulmonary effort is normal. No  respiratory distress.  Abdominal:     Palpations: Abdomen is soft.  Musculoskeletal:        General: No deformity.  Skin:    Findings: No rash.     Comments: 2.7 cm laceration noted to right index finger past the DIP.  No active bleeding.  Neurovascularly intact in the right upper extremity.  Neurological:     Mental Status: She is alert.     ED Results / Procedures / Treatments   Labs (all labs ordered are listed, but only abnormal results are displayed) Labs Reviewed - No data to display  EKG None  Radiology No results found.  Procedures .Marland KitchenLac repair Brittany Rodriguez  Date/Time: 11/03/2023 9:48 PM  Performed by: Marita Kansas, PA-C Authorized by: Marita Kansas, PA-C   Consent:    Consent obtained:  Verbal   Consent given by:  Patient   Risks discussed:  Need for additional repair, infection, retained foreign body, poor cosmetic result and poor wound healing   Alternatives discussed:  No treatment Universal protocol:    Procedure explained and questions answered to patient or proxy's satisfaction: yes     Relevant documents present and verified: yes     Patient identity confirmed:  Verbally with patient and arm band Anesthesia:    Anesthesia method:  Local infiltration   Local anesthetic:  Lidocaine 1% w/o epi Laceration details:    Location:  Finger   Finger location:  R index finger   Length (cm):  2.7 Pre-procedure details:    Preparation:  Patient was prepped and draped in usual sterile fashion Treatment:    Area cleansed with:  Saline and povidone-iodine   Amount of cleaning:  Standard   Irrigation solution:  Sterile saline   Irrigation volume:  250   Irrigation method:  Tap   Debridement:  None   Undermining:  None Skin repair:    Repair method:  Sutures   Suture size:  5-0   Suture material:  Prolene   Suture technique:  Simple interrupted   Number of sutures:  4 Approximation:    Approximation:  Close Repair type:    Repair type:  Simple Post-procedure  details:    Dressing:  Non-adherent dressing   Procedure completion:  Tolerated well, no immediate complications     Medications Ordered in ED Medications  lidocaine (PF) (XYLOCAINE) 1 % injection 5 mL (5 mLs Infiltration Given by Other 11/03/23 2123)    ED Course/ Medical Decision Making/ A&P                                 Medical Decision Making Risk Prescription drug management.   Right index finger laceration.  Repaired with 4 stitches.  See procedure note.  She is right-hand dominant.  Return precautions discussed.  Keflex prescribed.   Final Clinical Impression(s) / ED Diagnoses Final diagnoses:  Laceration of right index finger without foreign body without damage to nail, initial encounter    Rx / DC Orders ED Discharge Orders     None         Marita Kansas, PA-C 11/03/23 2149    Linwood Dibbles, MD 11/04/23 [phone]

## 2023-11-03 NOTE — ED Triage Notes (Signed)
Patient arrives ambulatory by POV with laceration to right pointer finger from a can opener. Tetanus within last 2 years.

## 2024-05-02 ENCOUNTER — Other Ambulatory Visit: Payer: Self-pay

## 2024-05-02 ENCOUNTER — Encounter: Payer: Self-pay | Admitting: *Deleted

## 2024-05-02 ENCOUNTER — Ambulatory Visit (INDEPENDENT_AMBULATORY_CARE_PROVIDER_SITE_OTHER)

## 2024-05-02 ENCOUNTER — Ambulatory Visit
Admission: EM | Admit: 2024-05-02 | Discharge: 2024-05-02 | Disposition: A | Discharge status: Home / Self Care | Attending: Internal Medicine | Admitting: Internal Medicine

## 2024-05-02 DIAGNOSIS — M25571 Pain in right ankle and joints of right foot: Secondary | ICD-10-CM | POA: Diagnosis not present

## 2024-05-02 DIAGNOSIS — M25471 Effusion, right ankle: Secondary | ICD-10-CM

## 2024-05-02 MED ORDER — NAPROXEN 500 MG PO TABS: 500.0000 mg | ORAL_TABLET | Freq: Two times a day (BID) | ORAL | 0 refills | Status: AC
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
## 2024-05-02 NOTE — ED Notes (Signed)
 Ace Wrap provided (right ankle) with direction/information.  Maude Sorrel CMA
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
## 2024-05-02 NOTE — Discharge Instructions (Addendum)
 Your x-rays of your ankle were negative for fracture or dislocation. You likely sprained your ankle.   Wear the ace wrap we provided in the clinic for the next couple of weeks to provide compression, stability, and comfort.  Please rest, ice, and elevate your ankle to help it heal and decrease inflammation.   Take naproxen every 12 hours as needed for pain and swelling.   Call the orthopedic provider listed on your discharge paperwork to schedule a follow-up appointment if your symptoms do not improve in the next 1-2 weeks with supportive care.  Return to urgent care if you experience worsening pain, numbness, tingling, change of color in your skin near the injury, or any other concerning symptoms.  I hope you feel better!

## 2024-09-01 ENCOUNTER — Ambulatory Visit
